# Patient Record
Sex: Male | Born: 1990 | Race: Black or African American | Hispanic: No | Marital: Single | Smoking: Current every day smoker
Health system: Southern US, Community
[De-identification: ages and names within clinical notes are randomized; demographics above are authoritative.]

## PROBLEM LIST (undated history)

## (undated) HISTORY — PX: APPENDECTOMY: SHX54

---
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
## 2009-12-04 ENCOUNTER — Emergency Department (HOSPITAL_COMMUNITY)
Admission: EM | Admit: 2009-12-04 | Discharge: 2009-12-04 | Payer: Self-pay | Source: Home / Self Care | Admitting: Emergency Medicine
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
## 2015-05-23 ENCOUNTER — Emergency Department
Admission: EM | Admit: 2015-05-23 | Discharge: 2015-05-23 | Disposition: A | Discharge status: Home / Self Care | Payer: Self-pay | Attending: Emergency Medicine | Admitting: Emergency Medicine

## 2015-05-23 ENCOUNTER — Encounter: Payer: Self-pay | Admitting: Emergency Medicine

## 2015-05-23 DIAGNOSIS — J0100 Acute maxillary sinusitis, unspecified: Secondary | ICD-10-CM | POA: Insufficient documentation

## 2015-05-23 DIAGNOSIS — Z720 Tobacco use: Secondary | ICD-10-CM | POA: Insufficient documentation

## 2015-05-23 DIAGNOSIS — J069 Acute upper respiratory infection, unspecified: Secondary | ICD-10-CM | POA: Insufficient documentation

## 2015-05-23 MED ORDER — AZITHROMYCIN 250 MG PO TABS: ORAL_TABLET | ORAL | Status: DC

## 2015-05-23 MED ORDER — CHLORPHENIRAMINE MALEATE 4 MG PO TABS: 4.0000 mg | ORAL_TABLET | Freq: Two times a day (BID) | ORAL | Status: DC | PRN

## 2015-05-23 MED ORDER — GUAIFENESIN-CODEINE 100-10 MG/5ML PO SOLN: 10.0000 mL | ORAL | Status: DC | PRN

## 2015-05-23 NOTE — ED Notes (Signed)
C/o sinus pressure.

## 2015-05-23 NOTE — ED Notes (Signed)
C/o nasal congestion and productive cough with headache x 2 weeks

## 2015-05-23 NOTE — Discharge Instructions (Signed)
Sinusitis, Adult °Sinusitis is redness, soreness, and inflammation of the paranasal sinuses. Paranasal sinuses are air pockets within the bones of your face. They are located beneath your eyes, in the middle of your forehead, and above your eyes. In healthy paranasal sinuses, mucus is able to drain out, and air is able to circulate through them by way of your nose. However, when your paranasal sinuses are inflamed, mucus and air can become trapped. This can allow bacteria and other germs to grow and cause infection. °Sinusitis can develop quickly and last only a short time (acute) or continue over a long period (chronic). Sinusitis that lasts for more than 12 weeks is considered chronic. °CAUSES °Causes of sinusitis include: °· Allergies. °· Structural abnormalities, such as displacement of the cartilage that separates your nostrils (deviated septum), which can decrease the air flow through your nose and sinuses and affect sinus drainage. °· Functional abnormalities, such as when the small hairs (cilia) that line your sinuses and help remove mucus do not work properly or are not present. °SIGNS AND SYMPTOMS °Symptoms of acute and chronic sinusitis are the same. The primary symptoms are pain and pressure around the affected sinuses. Other symptoms include: °· Upper toothache. °· Earache. °· Headache. °· Bad breath. °· Decreased sense of smell and taste. °· A cough, which worsens when you are lying flat. °· Fatigue. °· Fever. °· Thick drainage from your nose, which often is green and may contain pus (purulent). °· Swelling and warmth over the affected sinuses. °DIAGNOSIS °Your health care provider will perform a physical exam. During your exam, your health care provider may perform any of the following to help determine if you have acute sinusitis or chronic sinusitis: °· Look in your nose for signs of abnormal growths in your nostrils (nasal polyps). °· Tap over the affected sinus to check for signs of  infection. °· View the inside of your sinuses using an imaging device that has a light attached (endoscope). °If your health care provider suspects that you have chronic sinusitis, one or more of the following tests may be recommended: °· Allergy tests. °· Nasal culture. A sample of mucus is taken from your nose, sent to a lab, and screened for bacteria. °· Nasal cytology. A sample of mucus is taken from your nose and examined by your health care provider to determine if your sinusitis is related to an allergy. °TREATMENT °Most cases of acute sinusitis are related to a viral infection and will resolve on their own within 10 days. Sometimes, medicines are prescribed to help relieve symptoms of both acute and chronic sinusitis. These may include pain medicines, decongestants, nasal steroid sprays, or saline sprays. °However, for sinusitis related to a bacterial infection, your health care provider will prescribe antibiotic medicines. These are medicines that will help kill the bacteria causing the infection. °Rarely, sinusitis is caused by a fungal infection. In these cases, your health care provider will prescribe antifungal medicine. °For some cases of chronic sinusitis, surgery is needed. Generally, these are cases in which sinusitis recurs more than 3 times per year, despite other treatments. °HOME CARE INSTRUCTIONS °· Drink plenty of water. Water helps thin the mucus so your sinuses can drain more easily. °· Use a humidifier. °· Inhale steam 3-4 times a day (for example, sit in the bathroom with the shower running). °· Apply a warm, moist washcloth to your face 3-4 times a day, or as directed by your health care provider. °· Use saline nasal sprays to help   moisten and clean your sinuses. °· Take medicines only as directed by your health care provider. °· If you were prescribed either an antibiotic or antifungal medicine, finish it all even if you start to feel better. °SEEK IMMEDIATE MEDICAL CARE IF: °· You have  increasing pain or severe headaches. °· You have nausea, vomiting, or drowsiness. °· You have swelling around your face. °· You have vision problems. °· You have a stiff neck. °· You have difficulty breathing. °  °This information is not intended to replace advice given to you by your health care provider. Make sure you discuss any questions you have with your health care provider. °  °Document Released: 07/08/2005 Document Revised: 07/29/2014 Document Reviewed: 07/23/2011 °Elsevier Interactive Patient Education ©2016 [employer] ° °Upper Respiratory Infection, Adult °Most upper respiratory infections (URIs) are caused by a virus. A URI affects the nose, throat, and upper air passages. The most common type of URI is often called "the common cold." °HOME CARE  °· Take medicines only as told by your doctor. °· Gargle warm saltwater or take cough drops to comfort your throat as told by your doctor. °· Use a warm mist humidifier or inhale steam from a shower to increase air moisture. This may make it easier to breathe. °· Drink enough fluid to keep your pee (urine) clear or pale yellow. °· Eat soups and other clear broths. °· Have a healthy diet. °· Rest as needed. °· Go back to work when your fever is gone or your doctor says it is okay. °¨ You may need to stay home longer to avoid giving your URI to others. °¨ You can also wear a face mask and wash your hands often to prevent spread of the virus. °· Use your inhaler more if you have asthma. °· Do not use any tobacco products, including cigarettes, chewing tobacco, or electronic cigarettes. If you need help quitting, ask your doctor. °GET HELP IF: °· You are getting worse, not better. °· Your symptoms are not helped by medicine. °· You have chills. °· You are getting more short of breath. °· You have brown or red mucus. °· You have yellow or brown discharge from your nose. °· You have pain in your face, especially when you bend forward. °· You have a fever. °· You  have puffy (swollen) neck glands. °· You have pain while swallowing. °· You have white areas in the back of your throat. °GET HELP RIGHT AWAY IF:  °· You have very bad or constant: °¨ Headache. °¨ Ear pain. °¨ Pain in your forehead, behind your eyes, and over your cheekbones (sinus pain). °¨ Chest pain. °· You have long-lasting (chronic) lung disease and any of the following: °¨ Wheezing. °¨ Long-lasting cough. °¨ Coughing up blood. °¨ A change in your usual mucus. °· You have a stiff neck. °· You have changes in your: °¨ Vision. °¨ Hearing. °¨ Thinking. °¨ Mood. °MAKE SURE YOU:  °· Understand these instructions. °· Will watch your condition. °· Will get help right away if you are not doing well or get worse. °  °This information is not intended to replace advice given to you by your health care provider. Make sure you discuss any questions you have with your health care provider. °  °Document Released: 12/25/2007 Document Revised: 11/22/2014 Document Reviewed: 10/13/2013 °Elsevier Interactive Patient Education ©2016 [employer] ° °

## 2015-05-23 NOTE — ED Provider Notes (Signed)
[facility] Emergency Department Provider Note  ____________________________________________  Time seen: Approximately 3:00 PM  I have reviewed the triage vital signs and the nursing notes.   HISTORY  Chief Complaint Nasal Congestion and Cough    HPI PHINEAS MCENROE is a 24 y.o. male who presents for evaluation of sinus pressure and nasal congestion productive cough and headache for 2 weeks. Patient states symptoms not getting any better. History reviewed. No pertinent past medical history.  There are no active problems to display for this patient.   Past Surgical History  Procedure Laterality Date  . Appendectomy      Current Outpatient Rx  Name  Route  Sig  Dispense  Refill  . azithromycin (ZITHROMAX Z-PAK) 250 MG tablet      Take 2 tablets (500 mg) on  Day 1,  followed by 1 tablet (250 mg) once daily on Days 2 through 5.   6 each   0   . chlorpheniramine (CHLOR-TRIMETON) 4 MG tablet   Oral   Take 1 tablet (4 mg total) by mouth 2 (two) times daily as needed for allergies or rhinitis.   30 tablet   0   . guaiFENesin-codeine 100-10 MG/5ML syrup   Oral   Take 10 mLs by mouth every 4 (four) hours as needed for cough.   180 mL   0     Allergies Review of patient's allergies indicates no known allergies.  No family history on file.  Social History Social History  Substance Use Topics  . Smoking status: Current Every Day Smoker  . Smokeless tobacco: None  . Alcohol Use: No    Review of Systems Constitutional: No fever/chills Eyes: No visual changes. ENT: Recent sore throat, none now. Positive nasal congestion and drainage Cardiovascular: Denies chest pain. Respiratory: Denies shortness of breath. Gastrointestinal: No abdominal pain.  No nausea, no vomiting.  No diarrhea.  No constipation. Genitourinary: Negative for dysuria. Musculoskeletal: Negative for back pain. Skin: Negative for rash. Neurological: Positive for headaches,  negative for focal weakness or numbness.  10-point ROS otherwise negative.  ____________________________________________   PHYSICAL EXAM:  VITAL SIGNS: ED Triage Vitals  Enc Vitals Group     BP 05/23/15 1441 136/93 mmHg     Pulse Rate 05/23/15 1441 108     Resp 05/23/15 1441 18     Temp 05/23/15 1441 98.6 F (37 C)     Temp Source 05/23/15 1441 Oral     SpO2 05/23/15 1441 98 %     Weight 05/23/15 1441 140 lb (63.504 kg)     Height 05/23/15 1441 6' (1.829 m)     Head Cir --      Peak Flow --      Pain Score 05/23/15 1443 5     Pain Loc --      Pain Edu? --      Excl. in GC? --     Constitutional: Alert and oriented. Well appearing and in no acute distress. Eyes: Conjunctivae are normal. PERRL. EOMI. Head: Atraumatic. Positive maxillary tenderness bilateral Nose: Positive congestion/rhinnorhea, with copious amounts of mucus and nasal turbinate edema. Mouth/Throat: Mucous membranes are moist.  Oropharynx non-erythematous. Neck: No stridor.   Cardiovascular: Normal rate, regular rhythm. Grossly normal heart sounds.  Good peripheral circulation. Respiratory: Normal respiratory effort.  No retractions. Lungs CTAB. Gastrointestinal: Soft and nontender. No distention. No abdominal bruits. No CVA tenderness. Musculoskeletal: No lower extremity tenderness nor edema.  No joint effusions. Neurologic:  Normal speech  and language. No gross focal neurologic deficits are appreciated. No gait instability. Skin:  Skin is warm, dry and intact. No rash noted. Psychiatric: Mood and affect are normal. Speech and behavior are normal.  ____________________________________________   LABS (all labs ordered are listed, but only abnormal results are displayed)  Labs Reviewed - No data to display ____________________________________________   PROCEDURES  Procedure(s) performed: None  Critical Care performed: No  ____________________________________________   INITIAL IMPRESSION /  ASSESSMENT AND PLAN / ED COURSE  Pertinent labs & imaging results that were available during my care of the patient were reviewed by me and considered in my medical decision making (see chart for details).  Acute sinusitis/URI. Rx given for Zithromax, chlorpheniramine, Robitussin-AC. Work note 2 days. Patient follow-up with PCP or return here with any worsening symptomology. ____________________________________________   FINAL CLINICAL IMPRESSION(S) / ED DIAGNOSES  Final diagnoses:  Acute maxillary sinusitis, recurrence not specified  Acute URI     Evangeline Dakinharles M Amazin Pincock, PA-C 05/23/15 1542  Rockne MenghiniAnne-Caroline Norman, MD 05/23/15 1623

## 2017-02-08 ENCOUNTER — Emergency Department
Admission: EM | Admit: 2017-02-08 | Discharge: 2017-02-08 | Disposition: A | Discharge status: Home / Self Care | Payer: Self-pay | Attending: Emergency Medicine | Admitting: Emergency Medicine

## 2017-02-08 ENCOUNTER — Encounter: Payer: Self-pay | Admitting: Medical Oncology

## 2017-02-08 DIAGNOSIS — N341 Nonspecific urethritis: Secondary | ICD-10-CM

## 2017-02-08 DIAGNOSIS — Z202 Contact with and (suspected) exposure to infections with a predominantly sexual mode of transmission: Secondary | ICD-10-CM

## 2017-02-08 DIAGNOSIS — F17200 Nicotine dependence, unspecified, uncomplicated: Secondary | ICD-10-CM | POA: Insufficient documentation

## 2017-02-08 LAB — URINALYSIS, COMPLETE (UACMP) WITH MICROSCOPIC
Bacteria, UA: NONE SEEN
Bilirubin Urine: NEGATIVE
Glucose, UA: NEGATIVE mg/dL
HGB URINE DIPSTICK: NEGATIVE
Ketones, ur: NEGATIVE mg/dL
NITRITE: NEGATIVE
Protein, ur: NEGATIVE mg/dL
SPECIFIC GRAVITY, URINE: 1.03 (ref 1.005–1.030)
pH: 5 (ref 5.0–8.0)

## 2017-02-08 LAB — CHLAMYDIA/NGC RT PCR (ARMC ONLY)
CHLAMYDIA TR: NOT DETECTED
N GONORRHOEAE: DETECTED — AB

## 2017-02-08 MED ORDER — CEFTRIAXONE SODIUM 250 MG IJ SOLR
250.0000 mg | Freq: Once | INTRAMUSCULAR | Status: AC
Start: 2017-02-08 — End: 2017-02-08
  Administered 2017-02-08: 250 mg via INTRAMUSCULAR
  Filled 2017-02-08: qty 250

## 2017-02-08 MED ORDER — AZITHROMYCIN 500 MG PO TABS
1000.0000 mg | ORAL_TABLET | Freq: Once | ORAL | Status: AC
  Administered 2017-02-08: 1000 mg via ORAL
  Filled 2017-02-08: qty 2

## 2017-02-08 MED ORDER — METRONIDAZOLE 500 MG PO TABS: 2000.0000 mg | ORAL_TABLET | Freq: Once | ORAL | 0 refills | Status: AC

## 2017-02-08 NOTE — ED Provider Notes (Signed)
[facility] Emergency Department Provider Note ____________________________________________  Time seen: 1029  I have reviewed the triage vital signs and the nursing notes.  HISTORY  Chief Complaint  SEXUALLY TRANSMITTED DISEASE  HPI Luis Powers is a 26 y.o. male presents to the ED for evaluation of possible STD exposure. Patient describes noting this morning painful, burning with urination, and a greenish penile discharge. He denies any interim fevers, chills, or sweats. He also denies any penile lesions, pelvic pain, or abdominal pain.  History reviewed. No pertinent past medical history.  There are no active problems to display for this patient.   Past Surgical History:  Procedure Laterality Date  . APPENDECTOMY      Prior to Admission medications   Medication Sig Start Date End Date Taking? Authorizing Provider  azithromycin (ZITHROMAX Z-PAK) 250 MG tablet Take 2 tablets (500 mg) on  Day 1,  followed by 1 tablet (250 mg) once daily on Days 2 through 5. 05/23/15   Beers, Charmayne Sheerharles M, PA-C  chlorpheniramine (CHLOR-TRIMETON) 4 MG tablet Take 1 tablet (4 mg total) by mouth 2 (two) times daily as needed for allergies or rhinitis. 05/23/15   Beers, Charmayne Sheerharles M, PA-C  guaiFENesin-codeine 100-10 MG/5ML syrup Take 10 mLs by mouth every 4 (four) hours as needed for cough. 05/23/15   Beers, Charmayne Sheerharles M, PA-C  metroNIDAZOLE (FLAGYL) 500 MG tablet Take 4 tablets (2,000 mg total) by mouth once. 02/08/17 02/08/17  Brydan Downard, Charlesetta IvoryJenise V Bacon, PA-C    Allergies Patient has no known allergies.  No family history on file.  Social History Social History  Substance Use Topics  . Smoking status: Current Every Day Smoker  . Smokeless tobacco: Not on file  . Alcohol use No    Review of Systems  Constitutional: Negative for fever. Cardiovascular: Negative for chest pain. Respiratory: Negative for shortness of breath. Gastrointestinal: Negative for abdominal pain, vomiting and  diarrhea. Genitourinary: Positive for dysuria. Musculoskeletal: Negative for back pain. Skin: Negative for rash. Neurological: Negative for headaches, focal weakness or numbness. ____________________________________________  PHYSICAL EXAM:  VITAL SIGNS: ED Triage Vitals  Enc Vitals Group     BP 02/08/17 1022 140/86     Pulse Rate 02/08/17 1022 98     Resp 02/08/17 1022 16     Temp 02/08/17 1022 98.1 F (36.7 C)     Temp Source 02/08/17 1022 Oral     SpO2 02/08/17 1022 100 %     Weight 02/08/17 1023 190 lb (86.2 kg)     Height 02/08/17 1023 5\' 11"  (1.803 m)     Head Circumference --      Peak Flow --      Pain Score --      Pain Loc --      Pain Edu? --      Excl. in GC? --     Constitutional: Alert and oriented. Well appearing and in no distress. Head: Normocephalic and atraumatic. Cardiovascular: Normal rate, regular rhythm. Normal distal pulses. Respiratory: Normal respiratory effort. No wheezes/rales/rhonchi. Gastrointestinal: Soft and nontender. No distention. GU: deferred Musculoskeletal: Nontender with normal range of motion in all extremities.  Neurologic:  Normal gait without ataxia. Normal speech and language. No gross focal neurologic deficits are appreciated. Skin:  Skin is warm, dry and intact. No rash noted. Psychiatric: Mood and affect are normal. Patient exhibits appropriate insight and judgment. ____________________________________________   LABS (pertinent positives/negatives)  Labs Reviewed  URINALYSIS, COMPLETE (UACMP) WITH MICROSCOPIC - Abnormal; Notable for the following:  Result Value   Color, Urine YELLOW (*)    APPearance HAZY (*)    Leukocytes, UA LARGE (*)    Squamous Epithelial / LPF 0-5 (*)    All other components within normal limits  CHLAMYDIA/NGC RT PCR ([facility] ONLY)  ____________________________________________  PROCEDURES  Azithromycin 1 g PO Rocephin 250 mg  IM ____________________________________________  INITIAL IMPRESSION / ASSESSMENT AND PLAN / ED COURSE  Patient with ED evaluation of dysuria and urethritis. Patient is discharged after empiric treatment for gonorrhea & chlamydia. He will also be discharged with a prescription for Flagyl for empiric treatment of trichomoniasis. He will follow up with the [facility] for further management. He is further advised to avoid any contact for at least one week or until symptoms resolve, and to be sure that his partners are treated as well.GC culture is pending at the time of discharge ____________________________________________  FINAL CLINICAL IMPRESSION(S) / ED DIAGNOSES  Final diagnoses:  Urethritis, nonspecific  Exposure to STD      Ravyn Nikkel, Charlesetta Ivory, PA-C 02/08/17 1151    Jene Every, MD 02/08/17 1452

## 2017-02-08 NOTE — ED Notes (Signed)
ED Provider at bedside. 

## 2017-02-08 NOTE — ED Triage Notes (Signed)
Pt reports possible STD, states that this am he noted burning with urination and penile discharge.

## 2017-02-08 NOTE — Discharge Instructions (Signed)
You have been treated for gonorrhea and chlamydia. Your culture for these infections is still pending. You may call back in 2 hours to confirm results. You are also being treated for trichomoniasis, as a precaution. Take the antibiotic as directed. Follow-up with the [facility] for continued symptoms. Avoid sexual contact until all pills are gone, all symptoms are gone, and all partners have been treated.

## 2017-02-12 ENCOUNTER — Telehealth: Payer: Self-pay | Admitting: Emergency Medicine

## 2017-02-12 NOTE — Telephone Encounter (Signed)
Called patient to give std results.  Patient was treated in the ED.  Left message.

## 2017-04-12 ENCOUNTER — Emergency Department
Admission: EM | Admit: 2017-04-12 | Discharge: 2017-04-12 | Disposition: A | Discharge status: Home / Self Care | Payer: Self-pay | Attending: Emergency Medicine | Admitting: Emergency Medicine

## 2017-04-12 DIAGNOSIS — M545 Low back pain, unspecified: Secondary | ICD-10-CM

## 2017-04-12 DIAGNOSIS — F17200 Nicotine dependence, unspecified, uncomplicated: Secondary | ICD-10-CM | POA: Insufficient documentation

## 2017-04-12 MED ORDER — IBUPROFEN 600 MG PO TABS
600.0000 mg | ORAL_TABLET | Freq: Once | ORAL | Status: AC
  Administered 2017-04-12: 600 mg via ORAL
  Filled 2017-04-12: qty 1

## 2017-04-12 MED ORDER — CARISOPRODOL 350 MG PO TABS: 350.0000 mg | ORAL_TABLET | Freq: Three times a day (TID) | ORAL | 0 refills | Status: DC | PRN

## 2017-04-12 NOTE — ED Triage Notes (Signed)
Pt presents via POV c/o lower back pain. Pt reports pain started after carrying water bucket and twisting back at work. Denies urinary symptoms.

## 2017-04-12 NOTE — ED Provider Notes (Signed)
[facility] Emergency Department Provider Note  ____________________________________________   First MD Initiated Contact with Patient 04/12/17 0515     (approximate)  I have reviewed the triage vital signs and the nursing notes.   HISTORY  Chief Complaint Back Pain   HPI Luis Powers is a 26 y.o. male without any chronic medical problems was present emergency department today with left lower back pain. He says this started when he lifted about a 30 pound trash can filled with water at work. He says that ever since then he has been having cramping pain especially with movement. He denies any radiation of the pain. Denies any weakness or numbness. Denies any urinary retention or loss of bowel or bladder continence. He says the pain right now is 8 out of 10. He has tried an over-the-counter patch which has provided only minimal relief.   No past medical history on file.  There are no active problems to display for this patient.   Past Surgical History:  Procedure Laterality Date  . APPENDECTOMY      Prior to Admission medications   Medication Sig Start Date End Date Taking? Authorizing Provider  azithromycin (ZITHROMAX Z-PAK) 250 MG tablet Take 2 tablets (500 mg) on  Day 1,  followed by 1 tablet (250 mg) once daily on Days 2 through 5. 05/23/15   Beers, Charmayne Sheer, PA-C  chlorpheniramine (CHLOR-TRIMETON) 4 MG tablet Take 1 tablet (4 mg total) by mouth 2 (two) times daily as needed for allergies or rhinitis. 05/23/15   Beers, Charmayne Sheer, PA-C  guaiFENesin-codeine 100-10 MG/5ML syrup Take 10 mLs by mouth every 4 (four) hours as needed for cough. 05/23/15   Beers, Charmayne Sheer, PA-C    Allergies Patient has no known allergies.  No family history on file.  Social History Social History  Substance Use Topics  . Smoking status: Current Every Day Smoker  . Smokeless tobacco: Not on file  . Alcohol use No    Review of Systems  Constitutional: No  fever/chills Eyes: No visual changes. ENT: No sore throat. Cardiovascular: Denies chest pain. Respiratory: Denies shortness of breath. Gastrointestinal: No abdominal pain.  No nausea, no vomiting.  No diarrhea.  No constipation. Genitourinary: Negative for dysuria. Musculoskeletal: as above Skin: Negative for rash. Neurological: Negative for headaches, focal weakness or numbness.   ____________________________________________   PHYSICAL EXAM:  VITAL SIGNS: ED Triage Vitals  Enc Vitals Group     BP 04/12/17 0122 (!) 149/85     Pulse Rate 04/12/17 0122 98     Resp 04/12/17 0122 18     Temp 04/12/17 0122 98.3 F (36.8 C)     Temp Source 04/12/17 0122 Oral     SpO2 04/12/17 0122 100 %     Weight 04/12/17 0126 203 lb (92.1 kg)     Height 04/12/17 0126 6' (1.829 m)     Head Circumference --      Peak Flow --      Pain Score 04/12/17 0125 6     Pain Loc --      Pain Edu? --      Excl. in GC? --     Constitutional: Alert and oriented. Well appearing and in no acute distress. Eyes: Conjunctivae are normal.  Head: Atraumatic. Nose: No congestion/rhinnorhea. Mouth/Throat: Mucous membranes are moist.  Neck: No stridor.   Cardiovascular: Normal rate, regular rhythm. Grossly normal heart sounds.  Respiratory: Normal respiratory effort.  No retractions. Lungs CTAB. Gastrointestinal: Soft  and nontender. No distention. No CVA tenderness. Musculoskeletal: No lower extremity tenderness nor edema.  No joint effusions. moderate tenderness palpation over the left lateral lumbar region. No midline tenderness palpation. No step-off or deformity.  5-5 strength bilateral lower extremities without saddle anesthesia.  Neurologic:  Normal speech and language. No gross focal neurologic deficits are appreciated. Skin:  Skin is warm, dry and intact. No rash noted. Psychiatric: Mood and affect are normal. Speech and behavior are normal.  ____________________________________________    LABS (all labs ordered are listed, but only abnormal results are displayed)  Labs Reviewed - No data to display ____________________________________________  EKG   ____________________________________________  RADIOLOGY   ____________________________________________   PROCEDURES  Procedure(s) performed:   Procedures  Critical Care performed:   ____________________________________________   INITIAL IMPRESSION / ASSESSMENT AND PLAN / ED COURSE  Pertinent labs & imaging results that were available during my care of the patient were reviewed by me and considered in my medical decision making (see chart for details).  DDX: Musculoskeletal strain, low back pain, muscle spasm  Patient with history and physical consistent with muscular scrotal back pain. He'll be prescribed a muscle relaxer and will use over-the-counter anti-inflammatory such as ibuprofen. He is understanding of the plan and willing to comply. I will also give him a work note for the day as I feel the rest will be essential to his condition in order for her to improve. He is understanding the plan one to comply.      ____________________________________________   FINAL CLINICAL IMPRESSION(S) / ED DIAGNOSES  low back pain    NEW MEDICATIONS STARTED DURING THIS VISIT:  New Prescriptions   No medications on file     Note:  This document was prepared using Dragon voice recognition software and may include unintentional dictation errors.     Myrna Blazer, MD 04/12/17 [phone]

## 2017-04-12 NOTE — ED Notes (Signed)
ED Provider at bedside. 

## 2018-01-05 ENCOUNTER — Emergency Department (HOSPITAL_COMMUNITY)
Admission: EM | Admit: 2018-01-05 | Discharge: 2018-01-05 | Disposition: A | Discharge status: Home / Self Care | Payer: Self-pay | Attending: Emergency Medicine | Admitting: Emergency Medicine

## 2018-01-05 ENCOUNTER — Encounter (HOSPITAL_COMMUNITY): Payer: Self-pay

## 2018-01-05 ENCOUNTER — Other Ambulatory Visit: Payer: Self-pay

## 2018-01-05 DIAGNOSIS — L739 Follicular disorder, unspecified: Secondary | ICD-10-CM | POA: Insufficient documentation

## 2018-01-05 DIAGNOSIS — F17200 Nicotine dependence, unspecified, uncomplicated: Secondary | ICD-10-CM | POA: Insufficient documentation

## 2018-01-05 MED ORDER — CLINDAMYCIN PHOSPHATE 1 % EX GEL: Freq: Two times a day (BID) | CUTANEOUS | 0 refills | Status: DC

## 2018-01-05 NOTE — ED Triage Notes (Signed)
Pt states rash to his legs bilaterally X2 weeks. Reports minimal itching. Afebrile. Pt states he has used new soap.

## 2018-01-05 NOTE — Discharge Instructions (Addendum)
Please read attached information. If you experience any new or worsening signs or symptoms please return to the emergency room for evaluation. Please follow-up with your primary care provider or specialist as discussed. Please use medication prescribed only as directed and discontinue taking if you have any concerning signs or symptoms.   °

## 2018-01-05 NOTE — ED Provider Notes (Signed)
[facility] EMERGENCY [facility] Provider Note   CSN: [id number] Arrival date & time: 01/05/18  1114     History   Chief Complaint Chief Complaint  Patient presents with  . Rash    HPI Carolynne EdouardMarcus A Rosetti is a 27 y.o. male.  HPI    27 year old male presents today with complaints of rash to his thighs.  Patient notes approximately 2 weeks ago noted small red bumps on his thighs with small amount of pus in them.  Patient denies any history of the same, denies any abnormal exposures, denies any fever chills nausea or vomiting.  No history of allergies.  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  No medications prior to arrival.  No history of significant skin infections.    History reviewed. No pertinent past medical history.  There are no active problems to display for this patient.   Past Surgical History:  Procedure Laterality Date  . APPENDECTOMY          Home Medications    Prior to Admission medications   Medication Sig Start Date End Date Taking? Authorizing Provider  azithromycin (ZITHROMAX Z-PAK) 250 MG tablet Take 2 tablets (500 mg) on  Day 1,  followed by 1 tablet (250 mg) once daily on Days 2 through 5. 05/23/15   Beers, Charmayne Sheerharles M, PA-C  carisoprodol (SOMA) 350 MG tablet Take 1 tablet (350 mg total) by mouth 3 (three) times daily as needed. 04/12/17   Schaevitz, Myra Rudeavid Matthew, MD  chlorpheniramine (CHLOR-TRIMETON) 4 MG tablet Take 1 tablet (4 mg total) by mouth 2 (two) times daily as needed for allergies or rhinitis. 05/23/15   Beers, Charmayne Sheerharles M, PA-C  clindamycin (CLINDAGEL) 1 % gel Apply topically 2 (two) times daily. Please use for 10 days 01/05/18   Chevis Weisensel, Tinnie GensJeffrey, PA-C  guaiFENesin-codeine 100-10 MG/5ML syrup Take 10 mLs by mouth every 4 (four) hours as needed for cough. 05/23/15   Beers, Charmayne Sheerharles M, PA-C    Family History History reviewed. No pertinent family history.  Social  History Social History   Tobacco Use  . Smoking status: Current Every Day Smoker  . Smokeless tobacco: Never Used  Substance Use Topics  . Alcohol use: No  . Drug use: Not on file     Allergies   Patient has no known allergies.   Review of Systems Review of Systems  All other systems reviewed and are negative.    Physical Exam Updated Vital Signs BP (!) 137/91 (BP Location: Right Arm)   Pulse 94   Temp 98.5 F (36.9 C) (Oral)   Resp 18   SpO2 98%   Physical Exam  Constitutional: He is oriented to person, place, and time. He appears well-developed and well-nourished.  HENT:  Head: Normocephalic and atraumatic.  Eyes: Pupils are equal, round, and reactive to light. Conjunctivae are normal. Right eye exhibits no discharge. Left eye exhibits no discharge. No scleral icterus.  Neck: Normal range of motion. No JVD present. No tracheal deviation present.  Pulmonary/Chest: Effort normal. No stridor.  Neurological: He is alert and oriented to person, place, and time. Coordination normal.  Skin:  Small pustules noted to the follicles on the bilateral thighs, no other dermal involvement  Psychiatric: He has a normal mood and affect. His behavior is normal. Judgment and thought content normal.  Nursing note and vitals reviewed.    ED Treatments / Results  Labs (all  labs ordered are listed, but only abnormal results are displayed) Labs Reviewed - No data to display  EKG None  Radiology No results found.  Procedures Procedures (including critical care time)  Medications Ordered in ED Medications - No data to display   Initial Impression / Assessment and Plan / ED Course  I have reviewed the triage vital signs and the nursing notes.  Pertinent labs & imaging results that were available during my care of the patient were reviewed by me and considered in my medical decision making (see chart for details).     Patient presentation is most consistent with  folliculitis.  He was placed on topical clindamycin given follow-up information and return precautions.  Verbalized understanding and agreement to today's plan.  Final Clinical Impressions(s) / ED Diagnoses   Final diagnoses:  Folliculitis    ED Discharge Orders        Ordered    clindamycin (CLINDAGEL) 1 % gel  2 times daily     01/05/18 1402       Eyvonne Mechanic, PA-C 01/05/18 1446    Tilden Fossa, MD 01/06/18 1144

## 2018-01-19 ENCOUNTER — Other Ambulatory Visit: Payer: Self-pay

## 2018-01-19 ENCOUNTER — Emergency Department
Admission: EM | Admit: 2018-01-19 | Discharge: 2018-01-19 | Disposition: A | Discharge status: Home / Self Care | Payer: Self-pay | Attending: Emergency Medicine | Admitting: Emergency Medicine

## 2018-01-19 DIAGNOSIS — M79671 Pain in right foot: Secondary | ICD-10-CM | POA: Insufficient documentation

## 2018-01-19 DIAGNOSIS — F17210 Nicotine dependence, cigarettes, uncomplicated: Secondary | ICD-10-CM | POA: Insufficient documentation

## 2018-01-19 MED ORDER — IBUPROFEN 600 MG PO TABS: 600.0000 mg | ORAL_TABLET | Freq: Four times a day (QID) | ORAL | 0 refills | Status: DC | PRN

## 2018-01-19 NOTE — ED Provider Notes (Signed)
[facility] Emergency Department Provider Note  ____________________________________________  Time seen: Approximately 12:14 PM  I have reviewed the triage vital signs and the nursing notes.   HISTORY  Chief Complaint Foot Pain    HPI Luis EdouardMarcus A Powers is a 27 y.o. male that presents emergency department for evaluation of right right foot pain for 1 year.  Patient states that pain is worse with walking.  Pain improved a couple of months ago when he was switching jobs.  He had the same pain during his first job which resolved after he switched his shoes.  At his current job, he is on his feet all day.  Pain is primarily over his second and fifth toe.  He wears about 5 or 6 different types of shoes to work.  He has tried going up a size, down a size, wider shoes, narrow shoes.  He does not have pain when he is not wearing his shoes.  No specific trauma.  No alleviating measures have been attempted.  No leg pain, numbness, tingling.   History reviewed. No pertinent past medical history.  There are no active problems to display for this patient.   Past Surgical History:  Procedure Laterality Date  . APPENDECTOMY      Prior to Admission medications   Medication Sig Start Date End Date Taking? Authorizing Provider  azithromycin (ZITHROMAX Z-PAK) 250 MG tablet Take 2 tablets (500 mg) on  Day 1,  followed by 1 tablet (250 mg) once daily on Days 2 through 5. 05/23/15   Beers, Charmayne Sheerharles M, PA-C  carisoprodol (SOMA) 350 MG tablet Take 1 tablet (350 mg total) by mouth 3 (three) times daily as needed. 04/12/17   Schaevitz, Myra Rudeavid Matthew, MD  chlorpheniramine (CHLOR-TRIMETON) 4 MG tablet Take 1 tablet (4 mg total) by mouth 2 (two) times daily as needed for allergies or rhinitis. 05/23/15   Beers, Charmayne Sheerharles M, PA-C  clindamycin (CLINDAGEL) 1 % gel Apply topically 2 (two) times daily. Please use for 10 days 01/05/18   Hedges, Tinnie GensJeffrey, PA-C  guaiFENesin-codeine 100-10 MG/5ML syrup Take  10 mLs by mouth every 4 (four) hours as needed for cough. 05/23/15   Beers, Charmayne Sheerharles M, PA-C  ibuprofen (ADVIL,MOTRIN) 600 MG tablet Take 1 tablet (600 mg total) by mouth every 6 (six) hours as needed. 01/19/18   Enid DerryWagner, Flannery Cavallero, PA-C    Allergies Patient has no known allergies.  No family history on file.  Social History Social History   Tobacco Use  . Smoking status: Current Every Day Smoker    Packs/day: 0.50    Types: Cigarettes  . Smokeless tobacco: Never Used  Substance Use Topics  . Alcohol use: No  . Drug use: Never     Review of Systems  Constitutional: No fever/chills Musculoskeletal: Positive for foot pain.  Skin: Negative for rash, abrasions, lacerations, ecchymosis. Neurological: Negative for  numbness or tingling   ____________________________________________   PHYSICAL EXAM:  VITAL SIGNS: ED Triage Vitals  Enc Vitals Group     BP 01/19/18 1110 (!) 142/80     Pulse Rate 01/19/18 1110 85     Resp 01/19/18 1110 20     Temp 01/19/18 1110 98.2 F (36.8 C)     Temp src --      SpO2 01/19/18 1110 100 %     Weight 01/19/18 1111 190 lb (86.2 kg)     Height 01/19/18 1111 6' (1.829 m)     Head Circumference --  Peak Flow --      Pain Score 01/19/18 1121 3     Pain Loc --      Pain Edu? --      Excl. in GC? --      Constitutional: Alert and oriented. Well appearing and in no acute distress. Eyes: Conjunctivae are normal. PERRL. EOMI. Head: Atraumatic. ENT:      Ears:      Nose: No congestion/rhinnorhea.      Mouth/Throat: Mucous membranes are moist.  Neck: No stridor.   Cardiovascular: Normal rate, regular rhythm.  Good peripheral circulation. Respiratory: Normal respiratory effort without tachypnea or retractions. Lungs CTAB. Good air entry to the bases with no decreased or absent breath sounds. Musculoskeletal: Full range of motion to all extremities. No gross deformities appreciated. Full ROM of ankle and foot. Callus to lateral 2nd toe between  2nd toe and great toe. Sensation intact. No bruising, erythema, mass.   Neurologic:  Normal speech and language. No gross focal neurologic deficits are appreciated.  Skin:  Skin is warm, dry and intact. No rash noted. Psychiatric: Mood and affect are normal. Speech and behavior are normal. Patient exhibits appropriate insight and judgement.   ____________________________________________   LABS (all labs ordered are listed, but only abnormal results are displayed)  Labs Reviewed - No data to display ____________________________________________  EKG   ____________________________________________  RADIOLOGY   No results found.  ____________________________________________    PROCEDURES  Procedure(s) performed:    Procedures    Medications - No data to display   ____________________________________________   INITIAL IMPRESSION / ASSESSMENT AND PLAN / ED COURSE  Pertinent labs & imaging results that were available during my care of the patient were reviewed by me and considered in my medical decision making (see chart for details).  Review of the [place] CSRS was performed in accordance of the [facility] prior to dispensing any controlled drugs.     Patient presented to emergency department for evaluation of foot pain for 1 year.  Vital signs and exam are reassuring.   We discussed that he needs good fitting shoes for work. Patient will follow up with podiatry for definitive treatment.  Patient will be discharged home with prescriptions for ibuprofen. Patient is to follow up with podiatry as directed. Patient is given ED precautions to return to the ED for any worsening or new symptoms.     ____________________________________________  FINAL CLINICAL IMPRESSION(S) / ED DIAGNOSES  Final diagnoses:  Foot pain, right      NEW MEDICATIONS STARTED DURING THIS VISIT:  ED Discharge Orders        Ordered    ibuprofen (ADVIL,MOTRIN) 600 MG tablet  Every 6 hours PRN      01/19/18 1228          This chart was dictated using voice recognition software/Dragon. Despite best efforts to proofread, errors can occur which can change the meaning. Any change was purely unintentional.    Enid Derry, PA-C 01/19/18 1334    Jene Every, MD 01/19/18 1351

## 2018-01-19 NOTE — ED Triage Notes (Signed)
Pt c/o right foot pain that started one year ago - pt denies injury - pt reports the pain decreased with new shoes but the job he is working now is causing the pain to be worse

## 2018-01-19 NOTE — ED Notes (Signed)
Says his right foot has been sore and hurting for a long time--at least a year.  Recently it has gotten worse.  No wounds.  No injury.  Pain is towards his toes.

## 2018-02-06 ENCOUNTER — Other Ambulatory Visit: Payer: Self-pay

## 2018-02-06 ENCOUNTER — Emergency Department
Admission: EM | Admit: 2018-02-06 | Discharge: 2018-02-06 | Disposition: A | Discharge status: Home / Self Care | Payer: Self-pay | Attending: Emergency Medicine | Admitting: Emergency Medicine

## 2018-02-06 DIAGNOSIS — F17210 Nicotine dependence, cigarettes, uncomplicated: Secondary | ICD-10-CM | POA: Insufficient documentation

## 2018-02-06 DIAGNOSIS — L739 Follicular disorder, unspecified: Secondary | ICD-10-CM | POA: Insufficient documentation

## 2018-02-06 MED ORDER — CLINDAMYCIN HCL 300 MG PO CAPS: 300.0000 mg | ORAL_CAPSULE | Freq: Three times a day (TID) | ORAL | 0 refills | Status: DC

## 2018-02-06 MED ORDER — HYDROXYZINE HCL 50 MG PO TABS: 50.0000 mg | ORAL_TABLET | Freq: Three times a day (TID) | ORAL | 0 refills | Status: DC | PRN

## 2018-02-06 NOTE — ED Triage Notes (Signed)
Rash to bilateral legs. States he thinks he is allergic to "something at work". Itching. Rash for a few weeks. Pt alert and oriented X4, active, cooperative, pt in NAD. RR even and unlabored, color WNL.

## 2018-02-06 NOTE — ED Notes (Signed)
See triage note  Presents with rash to both legs  Sates he has had rash for several months  Has been seen for same but not able to afford the meds

## 2018-02-06 NOTE — ED Provider Notes (Signed)
[facility] Emergency Department Provider Note   ____________________________________________   First MD Initiated Contact with Patient 02/06/18 [phone]     (approximate)  I have reviewed the triage vital signs and the nursing notes.   HISTORY  Chief Complaint Rash    HPI Luis Powers is a 27 y.o. male patient presents with rash to bilateral legs.  Patient state onset of complaint when he started a new job.  Patient wears shorts at work.  Patient rash consists of pustular lesion.  Patient denies pain but says it is intermittent intense episode of itching.  Patient was [facility] 1 [facility] at [facility] for similar complaint.  Patient state he was unable to afford medication prescribed.  History reviewed. No pertinent past medical history.  There are no active problems to display for this patient.   Past Surgical History:  Procedure Laterality Date  . APPENDECTOMY      Prior to Admission medications   Medication Sig Start Date End Date Taking? Authorizing Provider  azithromycin (ZITHROMAX Z-PAK) 250 MG tablet Take 2 tablets (500 mg) on  Day 1,  followed by 1 tablet (250 mg) once daily on Days 2 through 5. 05/23/15   Beers, Charmayne Sheerharles M, PA-C  carisoprodol (SOMA) 350 MG tablet Take 1 tablet (350 mg total) by mouth 3 (three) times daily as needed. 04/12/17   Schaevitz, Myra Rudeavid Matthew, MD  chlorpheniramine (CHLOR-TRIMETON) 4 MG tablet Take 1 tablet (4 mg total) by mouth 2 (two) times daily as needed for allergies or rhinitis. 05/23/15   Beers, Charmayne Sheerharles M, PA-C  clindamycin (CLEOCIN) 300 MG capsule Take 1 capsule (300 mg total) by mouth 3 (three) times daily for 10 days. 02/06/18 02/16/18  Joni ReiningSmith, Ronald K, PA-C  clindamycin (CLINDAGEL) 1 % gel Apply topically 2 (two) times daily. Please use for 10 days 01/05/18   Hedges, Tinnie GensJeffrey, PA-C  guaiFENesin-codeine 100-10 MG/5ML syrup Take 10 mLs by mouth every 4 (four) hours as needed for cough. 05/23/15   Beers, Charmayne Sheerharles M, PA-C    hydrOXYzine (ATARAX/VISTARIL) 50 MG tablet Take 1 tablet (50 mg total) by mouth 3 (three) times daily as needed. 02/06/18   Joni ReiningSmith, Ronald K, PA-C  ibuprofen (ADVIL,MOTRIN) 600 MG tablet Take 1 tablet (600 mg total) by mouth every 6 (six) hours as needed. 01/19/18   Enid DerryWagner, Ashley, PA-C    Allergies Patient has no known allergies.  No family history on file.  Social History Social History   Tobacco Use  . Smoking status: Current Every Day Smoker    Packs/day: 0.50    Types: Cigarettes  . Smokeless tobacco: Never Used  Substance Use Topics  . Alcohol use: No  . Drug use: Never    Review of Systems  Constitutional: No fever/chills Eyes: No visual changes. ENT: No sore throat. Cardiovascular: Denies chest pain. Respiratory: Denies shortness of breath. Gastrointestinal: No abdominal pain.  No nausea, no vomiting.  No diarrhea.  No constipation. Genitourinary: Negative for dysuria. Musculoskeletal: Negative for back pain. Skin: Positive for rash. Neurological: Negative for headaches, focal weakness or numbness.   ____________________________________________   PHYSICAL EXAM:  VITAL SIGNS: ED Triage Vitals [02/06/18 0934]  Enc Vitals Group     BP (!) 157/91     Pulse Rate 95     Resp 14     Temp 98.2 F (36.8 C)     Temp Source Oral     SpO2 99 %     Weight 203 lb (92.1 kg)  Height 6' (1.829 m)     Head Circumference      Peak Flow      Pain Score 0     Pain Loc      Pain Edu?      Excl. in GC?    Constitutional: Alert and oriented. Well appearing and in no acute distress. Hematological/Lymphatic/Immunilogical: No cervical lymphadenopathy. Cardiovascular: Normal rate, regular rhythm. Grossly normal heart sounds.  Good peripheral circulation.  Respiratory: Normal respiratory effort.  No retractions. Lungs CTAB. tenderness nor edema.  No joint effusions. Neurologic:  Normal speech and language. No gross focal neurologic deficits are appreciated. No gait  instability. Skin:  Skin is warm, dry and intact.  Multiple pustular lesions bilateral lower extremities.   Psychiatric: Mood and affect are normal. Speech and behavior are normal.  ____________________________________________   LABS (all labs ordered are listed, but only abnormal results are displayed)  Labs Reviewed - No data to display ____________________________________________  EKG   ____________________________________________  RADIOLOGY  ED MD interpretation:    Official radiology report(s): No results found.  ____________________________________________   PROCEDURES  Procedure(s) performed: None  Procedures  Critical Care performed: No  ____________________________________________   INITIAL IMPRESSION / ASSESSMENT AND PLAN / ED COURSE  As part of my medical decision making, I reviewed the following data within the electronic MEDICAL RECORD NUMBER    Patient rash consistent with folliculitis.  Patient given discharge care instruction.  Patient given prescription for clindamycin and Atarax.  Patient [facility].      ____________________________________________   FINAL CLINICAL IMPRESSION(S) / ED DIAGNOSES  Final diagnoses:  Folliculitis     ED Discharge Orders        Ordered    clindamycin (CLEOCIN) 300 MG capsule  3 times daily     02/06/18 1020    hydrOXYzine (ATARAX/VISTARIL) 50 MG tablet  3 times daily PRN     02/06/18 1020       Note:  This document was prepared using Dragon voice recognition software and may include unintentional dictation errors.    Joni Reining, PA-C 02/06/18 1034    Rockne Menghini, MD 02/06/18 1257

## 2018-02-14 ENCOUNTER — Encounter: Payer: Self-pay | Admitting: Emergency Medicine

## 2018-02-14 ENCOUNTER — Emergency Department
Admission: EM | Admit: 2018-02-14 | Discharge: 2018-02-14 | Disposition: A | Discharge status: Home / Self Care | Payer: Self-pay | Attending: Emergency Medicine | Admitting: Emergency Medicine

## 2018-02-14 DIAGNOSIS — X500XXA Overexertion from strenuous movement or load, initial encounter: Secondary | ICD-10-CM | POA: Insufficient documentation

## 2018-02-14 DIAGNOSIS — Y990 Civilian activity done for income or pay: Secondary | ICD-10-CM | POA: Insufficient documentation

## 2018-02-14 DIAGNOSIS — Y929 Unspecified place or not applicable: Secondary | ICD-10-CM | POA: Insufficient documentation

## 2018-02-14 DIAGNOSIS — S29012A Strain of muscle and tendon of back wall of thorax, initial encounter: Secondary | ICD-10-CM | POA: Insufficient documentation

## 2018-02-14 DIAGNOSIS — T148XXA Other injury of unspecified body region, initial encounter: Secondary | ICD-10-CM

## 2018-02-14 DIAGNOSIS — Y9389 Activity, other specified: Secondary | ICD-10-CM | POA: Insufficient documentation

## 2018-02-14 DIAGNOSIS — F17210 Nicotine dependence, cigarettes, uncomplicated: Secondary | ICD-10-CM | POA: Insufficient documentation

## 2018-02-14 MED ORDER — BACLOFEN 10 MG PO TABS: 10.0000 mg | ORAL_TABLET | Freq: Every day | ORAL | 1 refills | Status: AC

## 2018-02-14 MED ORDER — CYCLOBENZAPRINE HCL 10 MG PO TABS
5.0000 mg | ORAL_TABLET | Freq: Once | ORAL | Status: AC
  Administered 2018-02-14: 5 mg via ORAL
  Filled 2018-02-14: qty 1

## 2018-02-14 MED ORDER — MELOXICAM 15 MG PO TABS: 15.0000 mg | ORAL_TABLET | Freq: Every day | ORAL | 2 refills | Status: AC

## 2018-02-14 MED ORDER — IBUPROFEN 600 MG PO TABS
600.0000 mg | ORAL_TABLET | Freq: Once | ORAL | Status: AC
  Administered 2018-02-14: 600 mg via ORAL
  Filled 2018-02-14: qty 1

## 2018-02-14 NOTE — Discharge Instructions (Addendum)
Follow-up with your regular doctor or orthopedics if not better 5 7 days.  Use medication as prescribed.  Apply ice to the areas that hurt.  I encouraged stretching and back exercises to keep your back stronger if you have a job where you do a lot of lifting.

## 2018-02-14 NOTE — ED Triage Notes (Signed)
Patient states that he thinks he pulled a muscle in his back. Patient with complaint of left side back pain that started yesterday after lifting and bending a lot yesterday. Patient states that he has some relief with heat.

## 2018-02-14 NOTE — ED Provider Notes (Signed)
[facility] Emergency Department Provider Note  ____________________________________________   First MD Initiated Contact with Patient 02/14/18 2118     (approximate)  I have reviewed the triage vital signs and the nursing notes.   HISTORY  Chief Complaint Back Pain    HPI Luis Powers is a 27 y.o. male presents emergency department complaining of a pulled muscle in his back.  He states he lives heavy objects while at work.  He denies any numbness or tingling.  Denies any spinal pain.  He felt some relief with heat.  He did take an over-the-counter ibuprofen.  He states he also needs a work note.  History reviewed. No pertinent past medical history.  There are no active problems to display for this patient.   Past Surgical History:  Procedure Laterality Date  . APPENDECTOMY      Prior to Admission medications   Medication Sig Start Date End Date Taking? Authorizing Provider  baclofen (LIORESAL) 10 MG tablet Take 1 tablet (10 mg total) by mouth daily. 02/14/18 02/14/19  Darik Massing, Roselyn Bering, PA-C  meloxicam (MOBIC) 15 MG tablet Take 1 tablet (15 mg total) by mouth daily. 02/14/18 02/14/19  Faythe Ghee, PA-C    Allergies Patient has no known allergies.  No family history on file.  Social History Social History   Tobacco Use  . Smoking status: Current Every Day Smoker    Packs/day: 0.50    Types: Cigarettes  . Smokeless tobacco: Never Used  Substance Use Topics  . Alcohol use: Yes    Comment: occ  . Drug use: Never    Review of Systems  Constitutional: No fever/chills Eyes: No visual changes. ENT: No sore throat. Respiratory: Denies cough Genitourinary: Negative for dysuria. Musculoskeletal: Positive for back pain. Skin: Negative for rash.    ____________________________________________   PHYSICAL EXAM:  VITAL SIGNS: ED Triage Vitals [02/14/18 2109]  Enc Vitals Group     BP 133/66     Pulse Rate 96     Resp 16     Temp  98.2 F (36.8 C)     Temp Source Oral     SpO2 99 %     Weight 205 lb (93 kg)     Height 5\' 11"  (1.803 m)     Head Circumference      Peak Flow      Pain Score 6     Pain Loc      Pain Edu?      Excl. in GC?     Constitutional: Alert and oriented. Well appearing and in no acute distress. Eyes: Conjunctivae are normal.  Head: Atraumatic. Nose: No congestion/rhinnorhea. Mouth/Throat: Mucous membranes are moist.   Cardiovascular: Normal rate, regular rhythm. Respiratory: Normal respiratory effort.  No retractions GU: deferred Musculoskeletal: FROM all extremities, warm and well perfused.  The spine is tender.  Small spasms are noted in the upper mid back.  He is neurovascularly intact.  He moves easily and without difficulty. Neurologic:  Normal speech and language.  Skin:  Skin is warm, dry and intact. No rash noted. Psychiatric: Mood and affect are normal. Speech and behavior are normal.  ____________________________________________   LABS (all labs ordered are listed, but only abnormal results are displayed)  Labs Reviewed - No data to display ____________________________________________   ____________________________________________  RADIOLOGY    ____________________________________________   PROCEDURES  Procedure(s) performed: No  Procedures    ____________________________________________   INITIAL IMPRESSION / ASSESSMENT AND PLAN / ED  COURSE  Pertinent labs & imaging results that were available during my care of the patient were reviewed by me and considered in my medical decision making (see chart for details).  Patient is a 27 year old male presents emergency department complaining of upper back pain after lifting multiple boxes at work.  He states it is relieved with heat.  He denies any numbness or tingling.  He states he also needs a work note.  Physical exam patient appears well.  Spine is not tender.  Some muscle spasm but the patient does move  quite easily.   Patient was given ibuprofen and Flexeril 5 mg p.o. while in the ED.  A prescription for baclofen and meloxicam were E scripted to his pharmacy.  He is to apply ice to all areas that hurt.  He can apply wet heat followed by ice to decrease inflammation spasms.  He was given back exercises to strengthen his back.  A work note limiting his lifting up to 20 pounds for the next 5 to 7 days.  He was not given an excuse for work.  Explained to him that this is not a reason to stay out of work but I will be glad to limit his amount of lifting.  His employer can decide whether or not he needs to return home.  He states he understands will comply with our instructions.  Was discharged in stable condition  As part of my medical decision making, I reviewed the following data within the electronic MEDICAL RECORD NUMBER Nursing notes reviewed and incorporated, Old chart reviewed, Notes from prior ED visits and [place] Controlled Substance Database  ____________________________________________   FINAL CLINICAL IMPRESSION(S) / ED DIAGNOSES  Final diagnoses:  Muscle strain      NEW MEDICATIONS STARTED DURING THIS VISIT:  Discharge Medication List as of 02/14/2018  9:29 PM    START taking these medications   Details  baclofen (LIORESAL) 10 MG tablet Take 1 tablet (10 mg total) by mouth daily., Starting Sat 02/14/2018, Until Sun 02/14/2019, Normal    meloxicam (MOBIC) 15 MG tablet Take 1 tablet (15 mg total) by mouth daily., Starting Sat 02/14/2018, Until Sun 02/14/2019, Normal         Note:  This document was prepared using Dragon voice recognition software and may include unintentional dictation errors.    Faythe GheeFisher, Isaish Alemu W, PA-C 02/14/18 2319    Sharman CheekStafford, Phillip, MD 02/16/18 [phone]

## 2019-11-13 ENCOUNTER — Emergency Department
Admission: EM | Admit: 2019-11-13 | Discharge: 2019-11-13 | Disposition: A | Discharge status: Home / Self Care | Payer: Self-pay | Attending: Student | Admitting: Student

## 2019-11-13 ENCOUNTER — Other Ambulatory Visit: Payer: Self-pay

## 2019-11-13 DIAGNOSIS — F17210 Nicotine dependence, cigarettes, uncomplicated: Secondary | ICD-10-CM | POA: Insufficient documentation

## 2019-11-13 DIAGNOSIS — Z202 Contact with and (suspected) exposure to infections with a predominantly sexual mode of transmission: Secondary | ICD-10-CM | POA: Insufficient documentation

## 2019-11-13 LAB — URINALYSIS, COMPLETE (UACMP) WITH MICROSCOPIC
Bilirubin Urine: NEGATIVE
Glucose, UA: NEGATIVE mg/dL
Ketones, ur: 15 mg/dL — AB
Nitrite: NEGATIVE
Protein, ur: NEGATIVE mg/dL
Specific Gravity, Urine: 1.025 (ref 1.005–1.030)
Squamous Epithelial / LPF: NONE SEEN (ref 0–5)
WBC, UA: >50 WBC/hpf (ref 0–5)
pH: 6.5 (ref 5.0–8.0)

## 2019-11-13 MED ORDER — CEFTRIAXONE SODIUM 250 MG IJ SOLR
250.0000 mg | Freq: Once | INTRAMUSCULAR | Status: AC
  Administered 2019-11-13: 250 mg via INTRAMUSCULAR
  Filled 2019-11-13: qty 250

## 2019-11-13 MED ORDER — AZITHROMYCIN 500 MG PO TABS
1000.0000 mg | ORAL_TABLET | Freq: Once | ORAL | Status: AC
  Administered 2019-11-13: 1000 mg via ORAL
  Filled 2019-11-13: qty 2

## 2019-11-13 MED ORDER — CEFTRIAXONE SODIUM 250 MG IJ SOLR
250.0000 mg | Freq: Once | INTRAMUSCULAR | Status: DC
  Filled 2019-11-13: qty 250

## 2019-11-13 NOTE — ED Provider Notes (Signed)
[facility] Emergency Department Provider Note  ____________________________________________  Time seen: Approximately 11:01 PM  I have reviewed the triage vital signs and the nursing notes.   HISTORY  Chief Complaint Exposure to STD    HPI Luis Powers is a 29 y.o. male who presents the emergency department complaining of dysuria, penile drainage.  Patient states that he has been exposed to an STD.  Patient is complaining of 2 days of symptoms.  No fevers or chills.  No abdominal pain.  No flank pain.  No medications for his complaint prior to arrival.         History reviewed. No pertinent past medical history.  There are no problems to display for this patient.   Past Surgical History:  Procedure Laterality Date  . APPENDECTOMY      Prior to Admission medications   Not on File    Allergies Patient has no known allergies.  No family history on file.  Social History Social History   Tobacco Use  . Smoking status: Current Every Day Smoker    Packs/day: 0.50    Types: Cigarettes  . Smokeless tobacco: Never Used  Substance Use Topics  . Alcohol use: Yes    Comment: occ  . Drug use: Never     Review of Systems  Constitutional: No fever/chills Eyes: No visual changes. No discharge ENT: No upper respiratory complaints. Cardiovascular: no chest pain. Respiratory: no cough. No SOB. Gastrointestinal: No abdominal pain.  No nausea, no vomiting.  No diarrhea.  No constipation. Genitourinary: Positive for dysuria, polyuria, penile drainage.. No hematuria Musculoskeletal: Negative for musculoskeletal pain. Skin: Negative for rash, abrasions, lacerations, ecchymosis. Neurological: Negative for headaches, focal weakness or numbness. 10-point ROS otherwise negative.  ____________________________________________   PHYSICAL EXAM:  VITAL SIGNS: ED Triage Vitals [11/13/19 2220]  Enc Vitals Group     BP (!) 136/98     Pulse Rate (!)  107     Resp 18     Temp 98.4 F (36.9 C)     Temp Source Oral     SpO2 100 %     Weight 230 lb (104.3 kg)     Height 6\' 1"  (1.854 m)     Head Circumference      Peak Flow      Pain Score 0     Pain Loc      Pain Edu?      Excl. in GC?      Constitutional: Alert and oriented. Well appearing and in no acute distress. Eyes: Conjunctivae are normal. PERRL. EOMI. Head: Atraumatic. ENT:      Ears:       Nose: No congestion/rhinnorhea.      Mouth/Throat: Mucous membranes are moist.  Neck: No stridor.    Cardiovascular: Normal rate, regular rhythm. Normal S1 and S2.  Good peripheral circulation. Respiratory: Normal respiratory effort without tachypnea or retractions. Lungs CTAB. Good air entry to the bases with no decreased or absent breath sounds. Gastrointestinal: Bowel sounds 4 quadrants. Soft and nontender to palpation. No guarding or rigidity. No palpable masses. No distention. No CVA tenderness. Genitourinary: Exam deferred Musculoskeletal: Full range of motion to all extremities. No gross deformities appreciated. Neurologic:  Normal speech and language. No gross focal neurologic deficits are appreciated.  Skin:  Skin is warm, dry and intact. No rash noted. Psychiatric: Mood and affect are normal. Speech and behavior are normal. Patient exhibits appropriate insight and judgement.   ____________________________________________   LABS (all  labs ordered are listed, but only abnormal results are displayed)  Labs Reviewed  URINALYSIS, COMPLETE (UACMP) WITH MICROSCOPIC - Abnormal; Notable for the following components:      Result Value   Hgb urine dipstick TRACE (*)    Ketones, ur 15 (*)    Leukocytes,Ua MODERATE (*)    Bacteria, UA RARE (*)    All other components within normal limits  CHLAMYDIA/NGC RT PCR ([facility] ONLY)   ____________________________________________  EKG   ____________________________________________  RADIOLOGY   No results  found.  ____________________________________________    PROCEDURES  Procedure(s) performed:    Procedures    Medications  cefTRIAXone (ROCEPHIN) injection 250 mg (has no administration in time range)  azithromycin (ZITHROMAX) tablet 1,000 mg (has no administration in time range)     ____________________________________________   INITIAL IMPRESSION / ASSESSMENT AND PLAN / ED COURSE  Pertinent labs & imaging results that were available during my care of the patient were reviewed by me and considered in my medical decision making (see chart for details).  Review of the [place] CSRS was performed in accordance of the [place] prior to dispensing any controlled drugs.           Patient's diagnosis is consistent with STD.  Patient presented to emergency department complaining of dysuria, penile drainage, polyuria after STD exposure.  Symptoms are consistent with STD.  I will test the patient's urine, however the patient will be treated empirically prior to return of results..  Patient will be treated with Rocephin and azithromycin.  Follow-up primary care health department as needed.  Patient is given ED precautions to return to the ED for any worsening or new symptoms.     ____________________________________________  FINAL CLINICAL IMPRESSION(S) / ED DIAGNOSES  Final diagnoses:  Exposure to STD      NEW MEDICATIONS STARTED DURING THIS VISIT:  ED Discharge Orders    None          This chart was dictated using voice recognition software/Dragon. Despite best efforts to proofread, errors can occur which can change the meaning. Any change was purely unintentional.    Darletta Moll, PA-C 11/13/19 2318    Lilia Pro., MD 11/14/19 [phone]

## 2019-11-13 NOTE — ED Triage Notes (Signed)
Reports concerned with exposure to STD. Burning with urination. Sx X 1 week.

## 2019-11-14 LAB — CHLAMYDIA/NGC RT PCR (ARMC ONLY)??????????
Chlamydia Tr: NOT DETECTED
N gonorrhoeae: DETECTED — AB

## 2019-11-14 LAB — CHLAMYDIA/NGC RT PCR (ARMC ONLY)

## 2019-11-15 ENCOUNTER — Telehealth: Payer: Self-pay | Admitting: Emergency Medicine

## 2019-11-15 NOTE — Telephone Encounter (Signed)
Called patient to inform of std results.  He was treated during visit.  Says he has let partner know of STD and need for treatment.

## 2023-04-09 ENCOUNTER — Emergency Department
Admission: EM | Admit: 2023-04-09 | Discharge: 2023-04-09 | Disposition: A | Discharge status: Home / Self Care | Payer: Self-pay | Attending: Emergency Medicine | Admitting: Emergency Medicine

## 2023-04-09 DIAGNOSIS — Z202 Contact with and (suspected) exposure to infections with a predominantly sexual mode of transmission: Secondary | ICD-10-CM | POA: Insufficient documentation

## 2023-04-09 DIAGNOSIS — N342 Other urethritis: Secondary | ICD-10-CM | POA: Insufficient documentation

## 2023-04-09 LAB — URINALYSIS, ROUTINE W REFLEX MICROSCOPIC
Bilirubin Urine: NEGATIVE
Glucose, UA: NEGATIVE mg/dL
Hgb urine dipstick: NEGATIVE
Ketones, ur: NEGATIVE mg/dL
Leukocytes,Ua: NEGATIVE
Nitrite: NEGATIVE
Protein, ur: NEGATIVE mg/dL
Specific Gravity, Urine: 1.025 (ref 1.005–1.030)
pH: 5 (ref 5.0–8.0)

## 2023-04-09 LAB — CHLAMYDIA/NGC RT PCR (ARMC ONLY)
Chlamydia Tr: NOT DETECTED
N gonorrhoeae: NOT DETECTED

## 2023-04-09 MED ORDER — LIDOCAINE HCL (PF) 1 % IJ SOLN
INTRAMUSCULAR | Status: AC
  Administered 2023-04-09: 5 mL
  Filled 2023-04-09: qty 5

## 2023-04-09 MED ORDER — CEFTRIAXONE SODIUM 1 G IJ SOLR
500.0000 mg | Freq: Once | INTRAMUSCULAR | Status: AC
  Administered 2023-04-09: 500 mg via INTRAMUSCULAR
  Filled 2023-04-09: qty 10

## 2023-04-09 MED ORDER — DOXYCYCLINE HYCLATE 100 MG PO CAPS: 100.0000 mg | ORAL_CAPSULE | Freq: Two times a day (BID) | ORAL | 0 refills | Status: AC

## 2023-04-09 NOTE — ED Triage Notes (Signed)
PT c/o painful urination and concern for STD that started last night. Pt denies discharge, no foul odor.

## 2023-04-09 NOTE — ED Provider Notes (Signed)
[facility] Provider Note    Event Date/Time   First MD Initiated Contact with Patient 04/09/23 2017     (approximate)   History   Chief Complaint Exposure to STD   HPI  Luis Powers is a 32 y.o. male with no significant past medical history who presents to the ED complaining of STD exposure.  Patient reports that he had a sexual encounter about 1 to 2 weeks ago when he is concerned he may have been exposed to an STD.  He states that he has had some mild dysuria since then but denies any penile discharge, groin swelling, or skin lesions.     Physical Exam   Triage Vital Signs: ED Triage Vitals  Encounter Vitals Group     BP 04/09/23 1740 (!) 160/112     Systolic BP Percentile --      Diastolic BP Percentile --      Pulse Rate 04/09/23 1740 (!) 102     Resp 04/09/23 1740 14     Temp 04/09/23 1740 98.4 F (36.9 C)     Temp Source 04/09/23 1740 Oral     SpO2 04/09/23 1740 98 %     Weight --      Height --      Head Circumference --      Peak Flow --      Pain Score 04/09/23 1741 0     Pain Loc --      Pain Education --      Exclude from Growth Chart --     Most recent vital signs: Vitals:   04/09/23 1740  BP: (!) 160/112  Pulse: (!) 102  Resp: 14  Temp: 98.4 F (36.9 C)  SpO2: 98%    Constitutional: Alert and oriented. Eyes: Conjunctivae are normal. Head: Atraumatic. Nose: No congestion/rhinnorhea. Mouth/Throat: Mucous membranes are moist.  Cardiovascular: Normal rate, regular rhythm. Grossly normal heart sounds.  2+ radial pulses bilaterally. Respiratory: Normal respiratory effort.  No retractions. Lungs CTAB. Gastrointestinal: Soft and nontender. No distention. Musculoskeletal: No lower extremity tenderness nor edema.  Neurologic:  Normal speech and language. No gross focal neurologic deficits are appreciated.    ED Results / Procedures / Treatments   Labs (all labs ordered are listed, but only abnormal results are  displayed) Labs Reviewed  URINALYSIS, ROUTINE W REFLEX MICROSCOPIC - Abnormal; Notable for the following components:      Result Value   Color, Urine YELLOW (*)    APPearance CLEAR (*)    Bacteria, UA RARE (*)    All other components within normal limits  CHLAMYDIA/NGC RT PCR ([facility] ONLY)              PROCEDURES:  Critical Care performed: No  Procedures   MEDICATIONS ORDERED IN ED: Medications  lidocaine (PF) (XYLOCAINE) 1 % injection (has no administration in time range)  cefTRIAXone (ROCEPHIN) injection 500 mg (500 mg Intramuscular Given 04/09/23 2054)     IMPRESSION / MDM / ASSESSMENT AND PLAN / ED COURSE  I reviewed the triage vital signs and the nursing notes.                              32 y.o. male with no significant past medical history who presents to the ED complaining of dysuria after recent sexual encounter.  Patient's presentation is most consistent with acute complicated illness / injury requiring diagnostic workup.  Differential diagnosis  includes, but is not limited to, urethritis, UTI, epididymitis.  Patient well-appearing and in no acute distress, denies groin lesions or swelling, complains only of mild dysuria.  Urinalysis is unremarkable, STD testing is pending at this time.  Patient desires empiric treatment and we will give dose of IM Rocephin, start him on doxycycline.  He was counseled to return to the ED for new or worsening symptoms.  Patient agrees with plan.      FINAL CLINICAL IMPRESSION(S) / ED DIAGNOSES   Final diagnoses:  STD exposure  Urethritis     Rx / DC Orders   ED Discharge Orders          Ordered    doxycycline (VIBRAMYCIN) 100 MG capsule  2 times daily        04/09/23 2023             Note:  This document was prepared using Dragon voice recognition software and may include unintentional dictation errors.   Chesley Noon, MD 04/09/23 [phone]
